# Patient Record
Sex: Female | Born: 1940 | Race: White | Marital: Married | Smoking: Never smoker
Health system: Southern US, Community
[De-identification: ages and names within clinical notes are randomized; demographics above are authoritative.]

## PROBLEM LIST (undated history)

## (undated) DIAGNOSIS — M349 Systemic sclerosis, unspecified: Secondary | ICD-10-CM

## (undated) DIAGNOSIS — H409 Unspecified glaucoma: Secondary | ICD-10-CM

## (undated) DIAGNOSIS — I10 Essential (primary) hypertension: Secondary | ICD-10-CM

## (undated) DIAGNOSIS — K219 Gastro-esophageal reflux disease without esophagitis: Secondary | ICD-10-CM

## (undated) DIAGNOSIS — J449 Chronic obstructive pulmonary disease, unspecified: Secondary | ICD-10-CM

## (undated) DIAGNOSIS — E079 Disorder of thyroid, unspecified: Secondary | ICD-10-CM

## (undated) DIAGNOSIS — Z86010 Personal history of colonic polyps: Secondary | ICD-10-CM

## (undated) HISTORY — DX: Gastro-esophageal reflux disease without esophagitis: K21.9

## (undated) HISTORY — DX: Systemic sclerosis, unspecified: M34.9

## (undated) HISTORY — DX: Personal history of colonic polyps: Z86.010

## (undated) HISTORY — PX: REPLACEMENT TOTAL KNEE: SUR1224

## (undated) HISTORY — DX: Chronic obstructive pulmonary disease, unspecified: J44.9

## (undated) HISTORY — DX: Essential (primary) hypertension: I10

## (undated) HISTORY — PX: CATARACT EXTRACTION, BILATERAL: SHX1313

## (undated) HISTORY — PX: COLONOSCOPY: SHX174

## (undated) HISTORY — DX: Unspecified glaucoma: H40.9

## (undated) HISTORY — DX: Disorder of thyroid, unspecified: E07.9

---

## 1984-02-08 HISTORY — PX: BACK SURGERY: SHX140

## 2017-03-09 HISTORY — PX: ESOPHAGOGASTRODUODENOSCOPY: SHX1529

## 2019-09-25 DIAGNOSIS — R079 Chest pain, unspecified: Secondary | ICD-10-CM | POA: Diagnosis not present

## 2019-09-27 DIAGNOSIS — R079 Chest pain, unspecified: Secondary | ICD-10-CM | POA: Diagnosis not present

## 2019-12-23 ENCOUNTER — Encounter: Payer: Self-pay | Admitting: Gastroenterology

## 2019-12-23 ENCOUNTER — Ambulatory Visit: Payer: Medicare Other | Admitting: Gastroenterology

## 2019-12-23 VITALS — BP 124/70 | HR 80 | Ht 59.0 in | Wt 119.4 lb

## 2019-12-23 DIAGNOSIS — K219 Gastro-esophageal reflux disease without esophagitis: Secondary | ICD-10-CM

## 2019-12-23 DIAGNOSIS — Z8739 Personal history of other diseases of the musculoskeletal system and connective tissue: Secondary | ICD-10-CM | POA: Diagnosis not present

## 2019-12-23 DIAGNOSIS — R1319 Other dysphagia: Secondary | ICD-10-CM | POA: Diagnosis not present

## 2019-12-23 MED ORDER — PANTOPRAZOLE SODIUM 40 MG PO TBEC
40.0000 mg | DELAYED_RELEASE_TABLET | Freq: Two times a day (BID) | ORAL | 11 refills | Status: DC
Start: 2019-12-23 — End: 2020-05-19

## 2019-12-23 NOTE — Progress Notes (Signed)
Chief Complaint: Dysphagia  Referring Provider:  Quitman Livings, MD      ASSESSMENT AND PLAN;   #1. GERD with eso dysphagia. H/O eso stricture,  D/d Schatzki's ring, motility disorder, EoE, pill induced esophagitis, r/o esophageal carcinoma/extrinsic lesions or Achalasia.  #2. 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 Constipation. Neg colon at age 79 per pt at [facility]. No need for rpt colon.  Plan: -Protonix 40 mg p.o. BID, #60.  11 refills. -Ba swallow with Ba tablet as well. -EGD with eso bx and possibly dil.  Discussed risks and benefits including small but definite risks of eso perforation, bleeding, risks of anesthesia.  The benefits were also discussed. Consent forms given. -I have instructed patient to chew foods especially meats and breads well and eat slowly. -Colace 1 tab po qd.    HPI:    Jenna Moran is a 79 y.o. 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  C/O dysphagia to both solids/liquids since Aug 2021.  Claims that food gets hung up in the throat area especially solids and liquids would get hung up in the lower chest.  She denies having any odynophagia.  Occasional regurgitation.  Solids mostly like meats and breads.  Had about 1 to 2 pound weight loss over the last 3 months.  Denies having any heartburn as long as she takes Protonix twice a day.  She has been on Nexium in the past which worked well.  Unfortunately, her insurance does not cover it.  No melena or hematochezia.  C/O constipation despite drinking plenty of water.  Has bowel movements at the frequency of 1 to 2/week.  Denies having any significant abdominal bloating.  No fever chills or night sweats.  No nonsteroidals.   From past records: -Known  scleroderma associated erosive esophagitis and stricture.  Multiple dilatations with Dr. Norma Fredrickson. -EGD 01/05/2017: esophageal stricture, dilated to 16.94mm and grade D esophagitis. She is here today for repeat EGD with dilation following BID PPI therapy.   -EGD 1/2019at [facility]: Post-operative Diagnosis: 1. Peptic stricture, appears improved since last dilation. This was dilated to 18 mm with mucosal disruption. Healed esophagitis. 2. Mod HH. Bx done to r/o EoE- I didn't see Bx report in care everywhere.    PMH: . Undifferentiated connective tissue disease (HCC)  . Inflammatory arthritis  . Scleroderma (HCC)  . Mild acid reflux  . ILD (interstitial lung disease) (HCC)  . Gastroesophageal reflux disease with esophagitis   Past Medical History:  Diagnosis Date  . GERD (gastroesophageal reflux disease)   . History of colonic polyps   . Hypertension   . Scleroderma (HCC)   . Thyroid disease     Past Surgical History:  Procedure Laterality Date  . CATARACT EXTRACTION, BILATERAL    . COLONOSCOPY     age 81 [facility]  . ESOPHAGOGASTRODUODENOSCOPY     around 2018 [facility]   . REPLACEMENT TOTAL KNEE Right     Family History  Problem Relation Age of Onset  . Colon cancer Maternal Grandmother   . Esophageal cancer Neg Hx     Social History   Tobacco Use  . Smoking status: Never Smoker  . Smokeless tobacco: Never Used  Vaping Use  . Vaping Use: Never used  Substance Use Topics  .  Alcohol use: Not Currently  . Drug use: Never    Current Outpatient Medications  Medication Sig Dispense Refill  . amLODipine (NORVASC) 5 MG tablet Take 5 mg by mouth daily.    . EUTHYROX 150 MCG tablet Take 1 tablet by mouth daily.    Marland Kitchen gabapentin (NEURONTIN) 300 MG capsule Take 300 mg by mouth 3 (three) times daily.    . Multiple Vitamins-Minerals (MULTIVITAMIN ADULTS 50+) TABS Take 1 tablet by mouth daily.    . pantoprazole (PROTONIX) 40 MG tablet Take 1 tablet by mouth  every morning.     No current facility-administered medications for this visit.    Allergies  Allergen Reactions  . Levofloxacin Rash and Hives  . Codeine Nausea And Vomiting and Other (See Comments)    "Deathly sick"   . Hydrocodone Nausea And Vomiting  . Oxycodone Nausea And Vomiting  . Plaquenil [Hydroxychloroquine]     Review of Systems:  Constitutional: Denies fever, chills, diaphoresis, appetite change and fatigue.  HEENT: Denies photophobia, eye pain, redness, hearing loss, ear pain, congestion, sore throat, rhinorrhea, sneezing, mouth sores, neck pain, neck stiffness and tinnitus.   Respiratory: Has occasional shortness of breath especially when she walks for a longer time and when she uses stairs. Cardiovascular: Denies chest pain, palpitations and leg swelling.  Genitourinary: Denies dysuria, urgency, frequency, hematuria, flank pain and difficulty urinating.  Musculoskeletal: Denies myalgias, back pain, joint swelling, arthralgias and gait problem.  Skin: No rash.  Neurological: Denies dizziness, seizures, syncope, weakness, light-headedness, numbness and headaches.  Hematological: Denies adenopathy. Easy bruising, personal or family bleeding history  Psychiatric/Behavioral: No anxiety or depression     Physical Exam:    BP 124/70   Pulse 80   Ht 4\' 11"  (1.499 m)   Wt 119 lb 6 oz (54.1 kg)   BMI 24.11 kg/m  Wt Readings from Last 3 Encounters:  12/23/19 119 lb 6 oz (54.1 kg)   Constitutional:  Well-developed, in no acute distress. Psychiatric: Normal mood and affect. Behavior is normal. HEENT: Conjunctivae are normal. No scleral icterus.  Cardiovascular: Normal rate, regular rhythm. No edema Pulmonary/chest: B/L decreased breath sounds with fine rales.  No wheezing. Abdominal: Soft, nondistended. Nontender. Bowel sounds active throughout. There are no masses palpable. No hepatomegaly. Rectal: Deferred Neurological: Alert and oriented to person place and  time. Skin: Skin is warm and dry. No rashes noted.  Data Reviewed: I have personally reviewed following labs and imaging studies  Labs were reviewed: -BUN/creatinine 54/1.13 with GFR 47 mL/min.  Potassium 5.1, albumin 4.3, normal LFTs.  Hemoglobin 11.5, platelets 95, MCV 92.  She has follow-up appointment with Dr. 12/25/19 to discuss labs and further work-up.   Dierdre Forth, MD 12/23/2019, 3:17 PM  Cc: 12/25/2019, MD

## 2019-12-23 NOTE — Patient Instructions (Signed)
If you are age 77 or older, your body mass index should be between 23-30. Your Body mass index is 24.11 kg/m. If this is out of the aforementioned range listed, please consider follow up with your Primary Care Provider.  If you are age 104 or younger, your body mass index should be between 19-25. Your Body mass index is 24.11 kg/m. If this is out of the aformentioned range listed, please consider follow up with your Primary Care Provider.   We have sent the following medications to your pharmacy for you to pick up at your convenience: Protonix   You have been scheduled for a Barium Esophogram at [facility] (1st floor of the hospital) on 12/31/19 at 10:30am. Please arrive 15 minutes prior to your appointment for registration. Make certain not to have anything to eat or drink 3 hours prior to your test. If you need to reschedule for any reason, please contact radiology at [phone] to do so. __________________________________________________________________ A barium swallow is an examination that concentrates on views of the esophagus. This tends to be a double contrast exam (barium and two liquids which, when combined, create a gas to distend the wall of the oesophagus) or single contrast (non-ionic iodine based). The study is usually tailored to your symptoms so a good history is essential. Attention is paid during the study to the form, structure and configuration of the esophagus, looking for functional disorders (such as aspiration, dysphagia, achalasia, motility and reflux) EXAMINATION You may be asked to change into a gown, depending on the type of swallow being performed. A radiologist and radiographer will perform the procedure. The radiologist will advise you of the type of contrast selected for your procedure and direct you during the exam. You will be asked to stand, sit or lie in several different positions and to hold a small amount of fluid in your mouth before being asked to  swallow while the imaging is performed .In some instances you may be asked to swallow barium coated marshmallows to assess the motility of a solid food bolus. The exam can be recorded as a digital or video fluoroscopy procedure. POST PROCEDURE It will take 1-2 days for the barium to pass through your system. To facilitate this, it is important, unless otherwise directed, to increase your fluids for the next 24-48hrs and to resume your normal diet.  This test typically takes about 30 minutes to perform. __________________________________________________________________________________  Bonita Quin have been scheduled for an endoscopy. Please follow written instructions given to you at your visit today. If you use inhalers (even only as needed), please bring them with you on the day of your procedure.   Please purchase the following medications over the counter and take as directed: Colace once daily.   Thank you,  Dr. Lynann Bologna

## 2019-12-27 ENCOUNTER — Encounter: Payer: Self-pay | Admitting: Gastroenterology

## 2019-12-31 ENCOUNTER — Ambulatory Visit (HOSPITAL_COMMUNITY)
Admission: RE | Admit: 2019-12-31 | Discharge: 2019-12-31 | Disposition: A | Discharge status: Home / Self Care | Payer: Medicare Other | Source: Ambulatory Visit | Attending: Gastroenterology | Admitting: Gastroenterology

## 2019-12-31 ENCOUNTER — Other Ambulatory Visit: Payer: Self-pay

## 2019-12-31 DIAGNOSIS — Z8739 Personal history of other diseases of the musculoskeletal system and connective tissue: Secondary | ICD-10-CM | POA: Insufficient documentation

## 2019-12-31 DIAGNOSIS — K219 Gastro-esophageal reflux disease without esophagitis: Secondary | ICD-10-CM | POA: Diagnosis not present

## 2019-12-31 DIAGNOSIS — R1319 Other dysphagia: Secondary | ICD-10-CM | POA: Insufficient documentation

## 2020-01-01 NOTE — Progress Notes (Signed)
Please inform the patient. Barium swallow-showing focal stricture, disruption of primary peristaltic waves consistent with scleroderma esophagus, small hiatal hernia and also aspiration Plan: -Proceed with EGD with dil -Also speech consultation Send report to family physician

## 2020-01-06 ENCOUNTER — Other Ambulatory Visit: Payer: Self-pay | Admitting: Gastroenterology

## 2020-01-06 NOTE — Progress Notes (Signed)
amb  

## 2020-01-07 ENCOUNTER — Telehealth: Payer: Self-pay | Admitting: Gastroenterology

## 2020-01-07 NOTE — Telephone Encounter (Signed)
Spoke to Jenna Moran from [employer]. She is requesting an order for a Modified Barium Swallow to complete the workup for speech therapy. Patient only had regular barium swallow. Order faxed over. They will contact the patient with a date and time. Once complete,she will be set up for speech therapy

## 2020-01-07 NOTE — Telephone Encounter (Signed)
Sarah from Rehab Dept at [facility] is requesting a call back from a nurse to discuss an order that she needs faxed over for a Modified Barium Swallow (pt only had a regular barium swallow)  CB [phone] Fax: [phone]

## 2020-01-10 ENCOUNTER — Encounter: Payer: Self-pay | Admitting: Gastroenterology

## 2020-01-10 ENCOUNTER — Ambulatory Visit (AMBULATORY_SURGERY_CENTER): Payer: Medicare Other | Admitting: Gastroenterology

## 2020-01-10 ENCOUNTER — Other Ambulatory Visit: Payer: Self-pay

## 2020-01-10 VITALS — BP 125/56 | HR 65 | Temp 98.0°F | Resp 22 | Ht 59.0 in | Wt 119.0 lb

## 2020-01-10 DIAGNOSIS — K225 Diverticulum of esophagus, acquired: Secondary | ICD-10-CM

## 2020-01-10 DIAGNOSIS — K219 Gastro-esophageal reflux disease without esophagitis: Secondary | ICD-10-CM | POA: Diagnosis not present

## 2020-01-10 DIAGNOSIS — K222 Esophageal obstruction: Secondary | ICD-10-CM

## 2020-01-10 DIAGNOSIS — R1319 Other dysphagia: Secondary | ICD-10-CM

## 2020-01-10 DIAGNOSIS — K449 Diaphragmatic hernia without obstruction or gangrene: Secondary | ICD-10-CM

## 2020-01-10 DIAGNOSIS — K2210 Ulcer of esophagus without bleeding: Secondary | ICD-10-CM

## 2020-01-10 MED ORDER — SUCRALFATE 1 G PO TABS: 1.0000 g | ORAL_TABLET | Freq: Four times a day (QID) | ORAL | 0 refills | Status: DC

## 2020-01-10 MED ORDER — SODIUM CHLORIDE 0.9 % IV SOLN: 500.0000 mL | Freq: Once | INTRAVENOUS | Status: DC

## 2020-01-10 NOTE — Progress Notes (Signed)
A and O x3. Report to RN. Tolerated MAC anesthesia well.Gums unchanged after procedure.

## 2020-01-10 NOTE — Patient Instructions (Signed)
Discharge instructions given. Handouts on a Dilatation diet,Esophagitis, and a hiatal hernia. Resume previous medications. YOU HAD AN ENDOSCOPIC PROCEDURE TODAY AT THE [facility]:   Refer to the procedure report that was given to you for any specific questions about what was found during the examination.  If the procedure report does not answer your questions, please call your gastroenterologist to clarify.  If you requested that your care partner not be given the details of your procedure findings, then the procedure report has been included in a sealed envelope for you to review at your convenience later.  YOU SHOULD EXPECT: Some feelings of bloating in the abdomen. Passage of more gas than usual.  Walking can help get rid of the air that was put into your GI tract during the procedure and reduce the bloating. If you had a lower endoscopy (such as a colonoscopy or flexible sigmoidoscopy) you may notice spotting of blood in your stool or on the toilet paper. If you underwent a bowel prep for your procedure, you may not have a normal bowel movement for a few days.  Please Note:  You might notice some irritation and congestion in your nose or some drainage.  This is from the oxygen used during your procedure.  There is no need for concern and it should clear up in a day or so.  SYMPTOMS TO REPORT IMMEDIATELY:     Following upper endoscopy (EGD)  Vomiting of blood or coffee ground material  New chest pain or pain under the shoulder blades  Painful or persistently difficult swallowing  New shortness of breath  Fever of 100F or higher  Black, tarry-looking stools  For urgent or emergent issues, a gastroenterologist can be reached at any hour by calling [phone]. Do not use MyChart messaging for urgent concerns.    DIET:  We do recommend a small meal at first, but then you may proceed to your regular diet.  Drink plenty of fluids but you should avoid alcoholic beverages for  24 hours.  ACTIVITY:  You should plan to take it easy for the rest of today and you should NOT DRIVE or use heavy machinery until tomorrow (because of the sedation medicines used during the test).    FOLLOW UP: Our staff will call the number listed on your records 48-72 hours following your procedure to check on you and address any questions or concerns that you may have regarding the information given to you following your procedure. If we do not reach you, we will leave a message.  We will attempt to reach you two times.  During this call, we will ask if you have developed any symptoms of COVID 19. If you develop any symptoms (ie: fever, flu-like symptoms, shortness of breath, cough etc.) before then, please call [phone].  If you test positive for Covid 19 in the 2 weeks post procedure, please call and report this information to [place].    If any biopsies were taken you will be contacted by phone or by letter within the next 1-3 weeks.  Please call us at [phone] if you have not heard about the biopsies in 3 weeks.    SIGNATURES/CONFIDENTIALITY: You and/or your care partner have signed paperwork which will be entered into your electronic medical record.  These signatures attest to the fact that that the information above on your After Visit Summary has been reviewed and is understood.  Full responsibility of the confidentiality of this discharge information lies with you  and/or your care-partner. 

## 2020-01-10 NOTE — Progress Notes (Signed)
Called to room to assist during endoscopic procedure.  Patient ID and intended procedure confirmed with present staff. Received instructions for my participation in the procedure from the performing physician.  

## 2020-01-10 NOTE — Op Note (Signed)
[facility] Patient Name: Jenna Moran Procedure Date: 01/10/2020 1:29 PM MRN: [id number] Endoscopist: Lynann Bologna , MD Age: 79 Referring MD:  Date of Birth: 1940-02-23 Gender: Female Account #: [id number] Procedure:                Upper GI endoscopy Indications:              Dysphagia with abnormal barium swallow showing                            distal esophageal stricture. History of scleroderma                            esophagus. Medicines:                Monitored Anesthesia Care Procedure:                Pre-Anesthesia Assessment:                           - Prior to the procedure, a History and Physical                            was performed, and patient medications and                            allergies were reviewed. The patient's tolerance of                            previous anesthesia was also reviewed. The risks                            and benefits of the procedure and the sedation                            options and risks were discussed with the patient.                            All questions were answered, and informed consent                            was obtained. Prior Anticoagulants: The patient has                            taken no previous anticoagulant or antiplatelet                            agents. ASA Grade Assessment: III - A patient with                            severe systemic disease. After reviewing the risks                            and benefits, the patient was deemed in  satisfactory condition to undergo the procedure.                           After obtaining informed consent, the endoscope was                            passed under direct vision. Throughout the                            procedure, the patient's blood pressure, pulse, and                            oxygen saturations were monitored continuously. The                            Endoscope was introduced through the mouth,  and                            advanced to the second part of duodenum. The upper                            GI endoscopy was accomplished without difficulty.                            The patient tolerated the procedure well. Scope In: Scope Out: Findings:                 One benign-appearing, intrinsic moderate                            (circumferential scarring or stenosis; an endoscope                            may pass) stenosis was found 35 cm from the                            incisors. This stenosis measured 9 mm (inner                            diameter) x less than one cm (in length). This                            would barely allow passage of GIF H190. The                            stenosis was traversed. There was moderate                            surrounding circumferential esophagitis with 8 mm                            diverticulum just proximal to the stricture. A TTS  dilator was passed through the scope. Dilation with                            an 12-20-11 mm balloon dilator was performed to 13                            mm. The dilation site was examined and showed mild                            mucosal disruption and moderate improvement in                            luminal narrowing. Biopsies were taken with a cold                            forceps for histology.                           A 2 cm hiatal hernia was present.                           The entire examined stomach was normal.                           The examined duodenum was normal. Complications:            No immediate complications. Estimated Blood Loss:     Estimated blood loss: none. Impression:               - Benign-appearing esophageal stenosis with grade D                            esophagitis. Dilated. Biopsied.                           - 8 mm esophageal diverticulum just proximal to the                            stricture.                           -  2 cm hiatal hernia. Recommendation:           - Patient has a contact number available for                            emergencies. The signs and symptoms of potential                            delayed complications were discussed with the                            patient. Return to normal activities tomorrow.                            Written discharge instructions were provided to the  patient.                           - Post dilatation diet.                           - Continue present medications including Protonix                            40 mg p.o. twice daily                           - Use sucralfate suspension 1 gram PO QID for 2                            weeks.                           - Recommend repeat EGD with further esophageal                            dilatation in 4 to 6 weeks.                           - Need to chew foods especially meats and breads                            well and eat slowly.                           - The findings and recommendations were discussed                            with the patient's family. Lynann Bologna, MD 01/10/2020 2:07:23 PM This report has been signed electronically.

## 2020-01-13 ENCOUNTER — Encounter: Payer: Medicare Other | Admitting: Gastroenterology

## 2020-01-13 NOTE — Telephone Encounter (Signed)
Fax from [facility] came back saying "spoke to her for the second time today. he declined her MBS study. Says she has an appt with Dr Chales Abrahams to evaluate swallowing on the 23rd"

## 2020-01-14 ENCOUNTER — Telehealth: Payer: Self-pay

## 2020-01-14 MED ORDER — ONDANSETRON 4 MG PO TBDP: 4.0000 mg | ORAL_TABLET | Freq: Three times a day (TID) | ORAL | 0 refills | Status: DC | PRN

## 2020-01-14 NOTE — Telephone Encounter (Signed)
Patient notified of the new recommendations.  She verbalized understanding of new recommendations and rx. No vomiting since phone call.

## 2020-01-14 NOTE — Telephone Encounter (Signed)
Left message for patient to call back New rx sent 

## 2020-01-14 NOTE — Telephone Encounter (Signed)
Called her at 9 AM No Ans Left message  Tresa Endo, Can you pl call and find out how she is doing? RG   For now Plan: -Continie protonix 40mg  po bid -Hold carafare -Zofran 4mg  ODT Q 8hrs prn  nausea #20  RG

## 2020-01-14 NOTE — Telephone Encounter (Signed)
  Follow up Call-  Call back number 01/10/2020  Post procedure Call Back phone  # [phone]  Permission to leave phone message Yes     Patient questions:  Do you have a fever, pain , or abdominal swelling? No. Pain Score  0 *  Have you tolerated food without any problems? No.  Have you been able to return to your normal activities? No.  Do you have any questions about your discharge instructions: Diet   Yes.   Medications  No. Follow up visit  No.  Do you have questions or concerns about your Care? Yes.     Called patient for post procedure f/u call. She state she hasn't been able to keep any food down since her procedure on Friday. She has vomited after completing every meal. She denies any coffee ground emesis, states it is "just food" coming back up. She has been able to keep fluids down such as water, pedialyte, and orange juice. Her last normal stool was Thursday the day before her procedure. She had stool yesterday that was hard, not blood noted, wasn't black and tarry per patient. She denies any other symptoms such as fever, cough, etc. Informed patient that I will send a message to Dr. Chales Abrahams immediately and get back with her today. Encouraged her to continue drinking fluids as she can to prevent dehydration. Pt. Verbalizes understanding and states she will do so. Best call back # is [phone]  Actions: * If pain score is 4 or above: No action needed, pain <4.  1. Have you developed a fever since your procedure? no  2.   Have you had an respiratory symptoms (SOB or cough) since your procedure? no  3.   Have you tested positive for COVID 19 since your procedure no  4.   Have you had any family members/close contacts diagnosed with the COVID 19 since your procedure?  no   If yes to any of these questions please route to Laverna Peace, RN and Karlton Lemon, RN

## 2020-01-21 ENCOUNTER — Telehealth: Payer: Self-pay | Admitting: Gastroenterology

## 2020-01-21 NOTE — Telephone Encounter (Signed)
Patient called states she is dihydrated seeking advise

## 2020-01-22 ENCOUNTER — Encounter: Payer: Self-pay | Admitting: Gastroenterology

## 2020-01-22 NOTE — Telephone Encounter (Signed)
LMOM for patient to call back

## 2020-01-22 NOTE — Telephone Encounter (Signed)
LMOM for patient to call back.

## 2020-01-23 NOTE — Telephone Encounter (Signed)
LMOM for patient to call opur office if she still feels dehydrated and needs further treatment.

## 2020-01-28 NOTE — Telephone Encounter (Signed)
LMOM for patient to call back.

## 2020-01-28 NOTE — Telephone Encounter (Signed)
Pt is requesting a call back from a nurse (missed call) 

## 2020-01-29 NOTE — Telephone Encounter (Signed)
LMOM for patient to call back.

## 2020-01-30 ENCOUNTER — Ambulatory Visit (AMBULATORY_SURGERY_CENTER): Payer: Self-pay | Admitting: *Deleted

## 2020-01-30 ENCOUNTER — Other Ambulatory Visit: Payer: Self-pay

## 2020-01-30 VITALS — Ht 59.0 in | Wt 119.0 lb

## 2020-01-30 DIAGNOSIS — R1319 Other dysphagia: Secondary | ICD-10-CM

## 2020-01-30 NOTE — Progress Notes (Signed)
Patient is here in-person for PV via wheelchair. Patient states she is able to stand and walk with assistance, husband with her today. Patient denies any allergies to eggs or soy. Patient denies any problems with anesthesia/sedation. Patient denies any oxygen use at home. Patient denies taking any diet/weight loss medications or blood thinners. Patient denies any medical hx changes since last EGD. Patient is not being treated for MRSA or C-diff. Patient is aware of our care-partner policy and Covid-19 safety protocol.  COVID-19 vaccines completed on 12/2019 booster, per patient.

## 2020-02-05 ENCOUNTER — Encounter: Payer: Self-pay | Admitting: Gastroenterology

## 2020-02-13 ENCOUNTER — Ambulatory Visit (AMBULATORY_SURGERY_CENTER): Payer: Medicare Other | Admitting: Gastroenterology

## 2020-02-13 ENCOUNTER — Encounter: Payer: Self-pay | Admitting: Gastroenterology

## 2020-02-13 ENCOUNTER — Other Ambulatory Visit: Payer: Self-pay

## 2020-02-13 VITALS — BP 130/70 | HR 75 | Temp 97.3°F | Resp 26

## 2020-02-13 DIAGNOSIS — K222 Esophageal obstruction: Secondary | ICD-10-CM | POA: Diagnosis not present

## 2020-02-13 DIAGNOSIS — R1319 Other dysphagia: Secondary | ICD-10-CM

## 2020-02-13 DIAGNOSIS — K449 Diaphragmatic hernia without obstruction or gangrene: Secondary | ICD-10-CM

## 2020-02-13 DIAGNOSIS — K219 Gastro-esophageal reflux disease without esophagitis: Secondary | ICD-10-CM

## 2020-02-13 DIAGNOSIS — K2970 Gastritis, unspecified, without bleeding: Secondary | ICD-10-CM

## 2020-02-13 MED ORDER — SODIUM CHLORIDE 0.9 % IV SOLN: 500.0000 mL | Freq: Once | INTRAVENOUS | Status: DC

## 2020-02-13 NOTE — Progress Notes (Signed)
Called to room to assist during endoscopic procedure.  Patient ID and intended procedure confirmed with present staff. Received instructions for my participation in the procedure from the performing physician.  

## 2020-02-13 NOTE — Progress Notes (Signed)
Report given to PACU, vss 

## 2020-02-13 NOTE — Progress Notes (Signed)
VS taken by BC 

## 2020-02-13 NOTE — Progress Notes (Signed)
Pt's states no medical or surgical changes since previsit or office visit. 

## 2020-02-13 NOTE — Progress Notes (Addendum)
Pt states, "I am cold and my legs hurt".  Per pt's husband she has hx of restless leg syndrome.  10:55`Pt rates pain 10/10. Alease Frame, RN reported this to Dr. Chales Abrahams.   I asked pt if she took her Gabapentin this am.  She said she did.  Pt reported that she was out of her muscle relaxer medication.  I advised pt's husband to call the pharmacy and see if they could see if a relill is available if not, get phamacy to call ask for a refill from the provider. Dr. Chales Abrahams was aware and ok with this measure.   Also told him the office will call when the schedule is out to reschedule egd with dil.   Per Dr. Chales Abrahams, repeat EGD with esophageal dilatation in 8-12 weeks.  The schedule is not out that far today.  I put this in the computer AS A RECALL egd.  MAW

## 2020-02-13 NOTE — Patient Instructions (Addendum)
Handouts were given to you on esophageal stricture, esophageal dilatation diet to follow the rest of today, gastritis, and a hiatal hernia. You may resume your current medications today. Await biopsy results.  May take 2-3 weeks to receive pathology results. Please call if any questions or concerns.     YOU HAD AN ENDOSCOPIC PROCEDURE TODAY AT THE [facility]:   Refer to the procedure report that was given to you for any specific questions about what was found during the examination.  If the procedure report does not answer your questions, please call your gastroenterologist to clarify.  If you requested that your care partner not be given the details of your procedure findings, then the procedure report has been included in a sealed envelope for you to review at your convenience later.  YOU SHOULD EXPECT: Some feelings of bloating in the abdomen. Passage of more gas than usual.  Walking can help get rid of the air that was put into your GI tract during the procedure and reduce the bloating. If you had a lower endoscopy (such as a colonoscopy or flexible sigmoidoscopy) you may notice spotting of blood in your stool or on the toilet paper. If you underwent a bowel prep for your procedure, you may not have a normal bowel movement for a few days.  Please Note:  You might notice some irritation and congestion in your nose or some drainage.  This is from the oxygen used during your procedure.  There is no need for concern and it should clear up in a day or so.  SYMPTOMS TO REPORT IMMEDIATELY:    Following upper endoscopy (EGD)  Vomiting of blood or coffee ground material  New chest pain or pain under the shoulder blades  Painful or persistently difficult swallowing  New shortness of breath  Fever of 100F or higher  Black, tarry-looking stools  For urgent or emergent issues, a gastroenterologist can be reached at any hour by calling [phone]. Do not use MyChart messaging for  urgent concerns.    DIET:  Please follow the esophageal dilatation diet the rest of today.  Handout was given to you to refer to. Please CHEW FOOD ESPECIALLY MEATS WELL AND EAT SLOWLY.  Drink plenty of fluids but you should avoid alcoholic beverages for 24 hours.  ACTIVITY:  You should plan to take it easy for the rest of today and you should NOT DRIVE or use heavy machinery until tomorrow (because of the sedation medicines used during the test).    FOLLOW UP: Our staff will call the number listed on your records 48-72 hours following your procedure to check on you and address any questions or concerns that you may have regarding the information given to you following your procedure. If we do not reach you, we will leave a message.  We will attempt to reach you two times.  During this call, we will ask if you have developed any symptoms of COVID 19. If you develop any symptoms (ie: fever, flu-like symptoms, shortness of breath, cough etc.) before then, please call [phone].  If you test positive for Covid 19 in the 2 weeks post procedure, please call and report this information to [place].    If any biopsies were taken you will be contacted by phone or by letter within the next 1-3 weeks.  Please call us at [phone] if you have not heard about the biopsies in 3 weeks.    SIGNATURES/CONFIDENTIALITY: You and/or your care partner have  signed paperwork which will be entered into your electronic medical record.  These signatures attest to the fact that that the information above on your After Visit Summary has been reviewed and is understood.  Full responsibility of the confidentiality of this discharge information lies with you and/or your care-partner.

## 2020-02-13 NOTE — Op Note (Signed)
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 Medicines:                Monitored Anesthesia Care Procedure:                Pre-Anesthesia Assessment:                           - Prior to the procedure, a History and Physical                            was performed, and patient medications and                            allergies were reviewed. The patient's tolerance of                            previous anesthesia was also reviewed. The risks                            and benefits of the procedure and the sedation                            options and risks were discussed with the patient.                            All questions were answered, and informed consent                            was obtained. Prior Anticoagulants: The patient has                            taken no previous anticoagulant or antiplatelet                            agents. ASA Grade Assessment: III - A patient with                            severe systemic disease. After reviewing the risks                            and benefits, the patient was deemed in                            satisfactory condition to undergo the procedure.  After obtaining informed consent, the endoscope was                            passed under direct vision. Throughout the                            procedure, the patient's blood pressure, pulse, and                            oxygen saturations were monitored continuously. The                            Endoscope was introduced through the mouth, and                            advanced to the second part of  duodenum. The upper                            GI endoscopy was accomplished without difficulty.                            The patient tolerated the procedure well. Scope In: Scope Out: Findings:                 One benign-appearing, intrinsic moderate                            (circumferential scarring or stenosis; an endoscope                            may pass) stenosis was found 35 cm from the                            incisors at GE junction. This stenosis measured 1                            cm (inner diameter) x less than one cm (in length).                            A 8 mm diverticula was noted just proximal to the                            stricture. The stenosis was traversed. A TTS                            dilator was passed through the scope. Dilation with                            a 13.5-14.5-15.5 mm balloon dilator was performed                            to 15.5 mm. The dilation site was examined and  showed mild mucosal disruption and moderate                            improvement in luminal narrowing. Biopsies were                            taken with a cold forceps for histology from the                            stricture.                           A 2 cm hiatal hernia was present.                           The entire examined stomach was normal except for                            atrophic gastritis. Biopsies were taken with a cold                            forceps for histology.                           The examined duodenum was normal. Biopsies for                            histology were taken with a cold forceps for                            evaluation of celiac disease. Complications:            No immediate complications. Estimated Blood Loss:     Estimated blood loss: none. 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 Dilated. Biopsied.                           - 2 cm hiatal hernia.                           - Atrophic gastritis Recommendation:           - Patient has a contact number available for                            emergencies. The signs and symptoms of potential                            delayed complications were discussed with the  patient. Return to normal activities tomorrow.                            Written discharge instructions were provided to the                            patient.                           - Post dilatation diet.                           - Continue present medications.                           - Await pathology results.                           - Recommend repeating EGD in 8-12 weeks with                            further dilatation.                           - Chew food especially meats well and eat slowly.                           - The findings and recommendations were discussed                            with the patient's family. Jackquline Denmark, MD 02/13/2020 10:21:18 AM This report has been signed electronically.

## 2020-02-17 ENCOUNTER — Telehealth: Payer: Self-pay | Admitting: *Deleted

## 2020-02-17 ENCOUNTER — Telehealth: Payer: Self-pay

## 2020-02-17 NOTE — Telephone Encounter (Signed)
Called [phone] and left a message we tried to reach pt for a follow up call. maw

## 2020-02-17 NOTE — Telephone Encounter (Signed)
  Follow up Call-  Call back number 02/13/2020 01/10/2020  Post procedure Call Back phone  # [phone]  Permission to leave phone message Yes Yes     Patient questions:  Do you have a fever, pain , or abdominal swelling? No. Pain Score  0 *  Have you tolerated food without any problems? Yes.    Have you been able to return to your normal activities? Yes.    Do you have any questions about your discharge instructions: Diet   No. Medications  No. Follow up visit  No.  Do you have questions or concerns about your Care? No.  Actions: * If pain score is 4 or above: No action needed, pain <4.  1. Have you developed a fever since your procedure? no  2.   Have you had an respiratory symptoms (SOB or cough) since your procedure? no  3.   Have you tested positive for COVID 19 since your procedure no  4.   Have you had any family members/close contacts diagnosed with the COVID 19 since your procedure?  no   If yes to any of these questions please route to Laverna Peace, RN and Karlton Lemon, RN

## 2020-02-24 ENCOUNTER — Encounter: Payer: Self-pay | Admitting: Gastroenterology

## 2020-05-05 ENCOUNTER — Other Ambulatory Visit: Payer: Self-pay

## 2020-05-05 ENCOUNTER — Ambulatory Visit (AMBULATORY_SURGERY_CENTER): Payer: Self-pay | Admitting: *Deleted

## 2020-05-05 VITALS — Ht 59.0 in | Wt 112.0 lb

## 2020-05-05 DIAGNOSIS — R1319 Other dysphagia: Secondary | ICD-10-CM

## 2020-05-05 NOTE — Progress Notes (Signed)
Patient and husband is here in-person for PV. Patient denies any allergies to eggs or soy. Patient denies any problems with anesthesia/sedation. Patient denies any oxygen use at home. Patient denies taking any diet/weight loss medications or blood thinners. Patient is not being treated for MRSA or C-diff. Patient is aware of our care-partner policy and Covid-19 safety protocol. Patient denies any medical hx changes since last GI visit.  Patient is fully COVID-19 vaccinated, per patient.

## 2020-05-15 ENCOUNTER — Encounter: Payer: Self-pay | Admitting: Gastroenterology

## 2020-05-19 ENCOUNTER — Other Ambulatory Visit: Payer: Self-pay

## 2020-05-19 ENCOUNTER — Encounter: Payer: Self-pay | Admitting: Gastroenterology

## 2020-05-19 ENCOUNTER — Ambulatory Visit (AMBULATORY_SURGERY_CENTER): Payer: Medicare Other | Admitting: Gastroenterology

## 2020-05-19 VITALS — BP 151/66 | HR 16 | Temp 97.5°F | Resp 17 | Ht 59.0 in | Wt 112.0 lb

## 2020-05-19 DIAGNOSIS — K449 Diaphragmatic hernia without obstruction or gangrene: Secondary | ICD-10-CM

## 2020-05-19 DIAGNOSIS — R1319 Other dysphagia: Secondary | ICD-10-CM

## 2020-05-19 DIAGNOSIS — K222 Esophageal obstruction: Secondary | ICD-10-CM | POA: Diagnosis not present

## 2020-05-19 MED ORDER — SUCRALFATE 1 G PO TABS: 1.0000 g | ORAL_TABLET | Freq: Four times a day (QID) | ORAL | 2 refills | Status: DC

## 2020-05-19 MED ORDER — PANTOPRAZOLE SODIUM 40 MG PO TBEC: 40.0000 mg | DELAYED_RELEASE_TABLET | Freq: Two times a day (BID) | ORAL | 11 refills | Status: AC

## 2020-05-19 MED ORDER — SUCRALFATE 1 GM/10ML PO SUSP: 1.0000 g | Freq: Four times a day (QID) | ORAL | 2 refills | Status: DC

## 2020-05-19 MED ORDER — SODIUM CHLORIDE 0.9 % IV SOLN
500.0000 mL | Freq: Once | INTRAVENOUS | Status: DC
Start: 2020-05-19 — End: 2020-05-19

## 2020-05-19 NOTE — Progress Notes (Signed)
Vitals-CW  Pt's states no medical or surgical changes since previsit or office visit.  Patient needed assistance undressing.

## 2020-05-19 NOTE — Progress Notes (Signed)
Called to room to assist during endoscopic procedure.  Patient ID and intended procedure confirmed with present staff. Received instructions for my participation in the procedure from the performing physician.  

## 2020-05-19 NOTE — Patient Instructions (Addendum)
Handouts provided on hiatal hernia and post-dilation diet.   Use Protonix (pantoprazole) 40mg  by mouth twice daily.  Use Sucralfate tablets 1 gram by mouth four times daily for 2 weeks.  Chew food especially meats and breads well and eat slowly. Feed in sitting position.  Return to GI clinic in 12 weeks.   YOU HAD AN ENDOSCOPIC PROCEDURE TODAY AT THE [facility]:   Refer to the procedure report that was given to you for any specific questions about what was found during the examination.  If the procedure report does not answer your questions, please call your gastroenterologist to clarify.  If you requested that your care partner not be given the details of your procedure findings, then the procedure report has been included in a sealed envelope for you to review at your convenience later.  YOU SHOULD EXPECT: Some feelings of bloating in the abdomen. Passage of more gas than usual.  Walking can help get rid of the air that was put into your GI tract during the procedure and reduce the bloating. If you had a lower endoscopy (such as a colonoscopy or flexible sigmoidoscopy) you may notice spotting of blood in your stool or on the toilet paper. If you underwent a bowel prep for your procedure, you may not have a normal bowel movement for a few days.  Please Note:  You might notice some irritation and congestion in your nose or some drainage.  This is from the oxygen used during your procedure.  There is no need for concern and it should clear up in a day or so.  SYMPTOMS TO REPORT IMMEDIATELY:   Following upper endoscopy (EGD)  Vomiting of blood or coffee ground material  New chest pain or pain under the shoulder blades  Painful or persistently difficult swallowing  New shortness of breath  Fever of 100F or higher  Black, tarry-looking stools  For urgent or emergent issues, a gastroenterologist can be reached at any hour by calling [phone]. Do not use MyChart messaging for  urgent concerns.    DIET:  Post-dilation diet: Clear liquids for 2 hours (until 12pm). Then a Soft diet (see handout) for the rest of today. You may return to your normal diet tomorrow if you are swallowing well. Drink plenty of fluids but you should avoid alcoholic beverages for 24 hours.  ACTIVITY:  You should plan to take it easy for the rest of today and you should NOT DRIVE or use heavy machinery until tomorrow (because of the sedation medicines used during the test).    FOLLOW UP: Our staff will call the number listed on your records 48-72 hours following your procedure to check on you and address any questions or concerns that you may have regarding the information given to you following your procedure. If we do not reach you, we will leave a message.  We will attempt to reach you two times.  During this call, we will ask if you have developed any symptoms of COVID 19. If you develop any symptoms (ie: fever, flu-like symptoms, shortness of breath, cough etc.) before then, please call [phone].  If you test positive for Covid 19 in the 2 weeks post procedure, please call and report this information to [phone].    If any biopsies were taken you will be contacted by phone or by letter within the next 1-3 weeks.  Please call us at [phone] if you have not heard about the biopsies in 3 weeks.  SIGNATURES/CONFIDENTIALITY: You and/or your care partner have signed paperwork which will be entered into your electronic medical record.  These signatures attest to the fact that that the information above on your After Visit Summary has been reviewed and is understood.  Full responsibility of the confidentiality of this discharge information lies with you and/or your care-partner.

## 2020-05-19 NOTE — Op Note (Signed)
[facility] Patient Name: Jenna Moran Procedure Date: 05/19/2020 9:42 AM MRN: [id number] Endoscopist: Lynann Bologna , MD Age: 80 Referring MD:  Date of Birth: 1940/10/20 Gender: Female Account #: [id number] Procedure:                Upper GI endoscopy Indications:              Dysphagia in a patient with known history of                            esophageal stricture. History of scleroderma                            esophagus. Medicines:                Monitored Anesthesia Care Procedure:                Pre-Anesthesia Assessment:                           - Prior to the procedure, a History and Physical                            was performed, and patient medications and                            allergies were reviewed. The patient's tolerance of                            previous anesthesia was also reviewed. The risks                            and benefits of the procedure and the sedation                            options and risks were discussed with the patient.                            All questions were answered, and informed consent                            was obtained. Prior Anticoagulants: The patient has                            taken no previous anticoagulant or antiplatelet                            agents. ASA Grade Assessment: III - A patient with                            severe systemic disease. After reviewing the risks                            and benefits, the patient was deemed in  satisfactory condition to undergo the procedure.                           After obtaining informed consent, the endoscope was                            passed under direct vision. Throughout the                            procedure, the patient's blood pressure, pulse, and                            oxygen saturations were monitored continuously. The                            Endoscope was introduced through the mouth, and                             advanced to the second part of duodenum. The upper                            GI endoscopy was accomplished without difficulty.                            The patient tolerated the procedure well. Scope In: Scope Out: Findings:                 The esophagus was mildly dilated and atonic c/w                            scleroderma esophagus. One benign-appearing,                            intrinsic mild stenosis was found 35 cm from the                            incisors at GE junction. This stenosis measured 1.4                            cm (inner diameter) x less than one cm (in length).                            The stenosis was traversed. Moderate esophagitis                            was noted in form of linear erosions associated                            with stricture (LA grade D). 8 mm diverticula was                            noted just proximal to the stricture. A TTS dilator  was passed through the scope. Dilation with a                            16-17-18 mm balloon dilator was performed to 17 mm.                            The dilation site was examined and showed moderate                            mucosal disruption and complete resolution of                            luminal narrowing. There was some oozing which                            stopped on its own. Hence, we decided to hold off                            on further dilatation. Estimated blood loss was                            minimal.                           A 2-3 cm hiatal hernia was present.                           The entire examined stomach was grossly normal.                            There was retained food in the stomach limiting                            examination.                           The examined duodenum was normal. Complications:            No immediate complications. Estimated Blood Loss:     Estimated blood loss was  minimal. Impression:               - Benign-appearing esophageal stenosis. Dilated.                           - 3 cm hiatal hernia.                           - Normal stomach.                           - Normal examined duodenum.                           - No specimens collected. Recommendation:           - Patient has a contact number available for  emergencies. The signs and symptoms of potential                            delayed complications were discussed with the                            patient. Return to normal activities tomorrow.                            Written discharge instructions were provided to the                            patient.                           - Post dilatation diet.                           - Use Protonix (pantoprazole) 40 mg PO BID #60, 11                            refills                           - Use sucralfate tablets 1 gram PO QID for 2 weeks.                            2 refills                           - Chew food especially meats and breads well and                            eat slowly. Feed in sitting position.                           - Return to GI clinic in 12 weeks.                           - The findings and recommendations were discussed                            with the patient's family. Lynann Bologna, MD 05/19/2020 10:09:46 AM This report has been signed electronically.

## 2020-05-19 NOTE — Progress Notes (Signed)
To PACU, VSS. Report to [email or web address] 

## 2020-05-21 ENCOUNTER — Telehealth: Payer: Self-pay

## 2020-05-21 ENCOUNTER — Telehealth: Payer: Self-pay | Admitting: *Deleted

## 2020-05-21 ENCOUNTER — Encounter: Payer: Self-pay | Admitting: Gastroenterology

## 2020-05-21 NOTE — Telephone Encounter (Signed)
First post procedure follow up call, no answer 

## 2020-05-21 NOTE — Telephone Encounter (Signed)
  Follow up Call-  Call back number 05/19/2020 02/13/2020 01/10/2020  Post procedure Call Back phone  # [phone]  Permission to leave phone message Yes Yes Yes     Patient questions:  Do you have a fever, pain , or abdominal swelling? No. Pain Score  0 *  Have you tolerated food without any problems? Yes.    Have you been able to return to your normal activities? Yes.    Do you have any questions about your discharge instructions: Diet   No. Medications  No. Follow up visit  No.  Do you have questions or concerns about your Care? No.  Actions: * If pain score is 4 or above: No action needed, pain <4.  1. Have you developed a fever since your procedure? no  2.   Have you had an respiratory symptoms (SOB or cough) since your procedure? no  3.   Have you tested positive for COVID 19 since your procedure no  4.   Have you had any family members/close contacts diagnosed with the COVID 19 since your procedure?  no   If yes to any of these questions please route to Laverna Peace, RN and Karlton Lemon, RN

## 2020-08-17 ENCOUNTER — Ambulatory Visit: Payer: Medicare Other | Admitting: Gastroenterology

## 2020-11-06 ENCOUNTER — Other Ambulatory Visit: Payer: Self-pay

## 2020-11-06 ENCOUNTER — Ambulatory Visit (INDEPENDENT_AMBULATORY_CARE_PROVIDER_SITE_OTHER): Payer: Medicare Other | Admitting: Gastroenterology

## 2020-11-06 ENCOUNTER — Encounter: Payer: Self-pay | Admitting: Gastroenterology

## 2020-11-06 VITALS — BP 98/58 | HR 82 | Ht 59.0 in | Wt 98.0 lb

## 2020-11-06 DIAGNOSIS — K7460 Unspecified cirrhosis of liver: Secondary | ICD-10-CM

## 2020-11-06 DIAGNOSIS — R188 Other ascites: Secondary | ICD-10-CM | POA: Diagnosis not present

## 2020-11-06 DIAGNOSIS — R1319 Other dysphagia: Secondary | ICD-10-CM | POA: Diagnosis not present

## 2020-11-06 DIAGNOSIS — K219 Gastro-esophageal reflux disease without esophagitis: Secondary | ICD-10-CM | POA: Diagnosis not present

## 2020-11-06 NOTE — Patient Instructions (Signed)
If you are age 78 or older, your body mass index should be between 23-30. Your Body mass index is 19.79 kg/m. If this is out of the aforementioned range listed, please consider follow up with your Primary Care Provider.  If you are age 26 or younger, your body mass index should be between 19-25. Your Body mass index is 19.79 kg/m. If this is out of the aformentioned range listed, please consider follow up with your Primary Care Provider.   __________________________________________________________  The  GI providers would like to encourage you to use [facility] to communicate with providers for non-urgent requests or questions.  Due to long hold times on the telephone, sending your provider a message by [facility] may be a faster and more efficient way to get a response.  Please allow 48 business hours for a response.  Please remember that this is for non-urgent requests.   Please follow up in 3 months.  Please call with any questions or concerns.  Thank you,  Dr. Lynann Bologna

## 2020-11-06 NOTE — Progress Notes (Signed)
Chief Complaint: FU  Referring Provider:  Quitman Livings, MD      ASSESSMENT AND PLAN;   #1. 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 No EV on EGD 05/2020.  Assoc splenomegaly and borderline platelets, no HE. Not a liver transplant candidate d/t multiple comorbid conditions.  Well-preserved synthetic liver function with alb 3.1 10/2020.  #2. GERD with dysphagia d/t eso stricture s/p dil 17 mm with resolution.  #3. 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 Constipation. Neg colon at age 60 per pt at [facility]. No need for rpt colon.  Plan: -Low Na diet diet with normal protein diet. -Aldactone 25mg  po BID to continue.  Can continue Lasix on as needed basis. -Decrease K supplements to qd -FU in 3 months -D/W extensively with pt and pt's husband. -Under excellent care with Dr. .     HPI:    Jenna Moran is a 80 y.o. 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 1995  With new onset liver cirrhosis on CT with ascites, BM Bx showing atypical lymphohistiocytic proliferation (granulomatous involvement likely sarcoidosis) on prednisone.  Patient is wheelchair-bound  She denies having any nausea, vomiting, heartburn, regurgitation, diarrhea or constipation.  She denies having any abdominal pain. No jaundice, dark urine or recent weight loss.  No further dysphagia after dilatation 05/2020 to 17 mm TTS balloon.  She is currently on Aldactone 25 mg p.o. twice daily.  Her previous  potassium was 2.9.  She was given potassium supplements BID.  Her most recent K is 5.7 (as below).  She uses Lasix on as needed basis.  No heartburn. No change in mental status.  No fever chills or night sweats.   From past records:  -09/2020 Adm d/t N/V/D, better with reglan/zofran/remeron. Given short course of Marinol. CT as below showed cirrhosis.  Also had repeat bone marrow biopsy.  CT AP 09/10/2020 1. Cirrhosis, with large volume ascites increased since prior study.  2. Mild diffuse bowel wall thickening involving the small bowel and  colon, likely due to hypoproteinemic state given cirrhosis and  adjacent ascites.  3. Cholelithiasis without cholecystitis.  4. Small bilateral pleural effusions.  5. Stable hiatal hernia, with fluid-filled dilated distal esophagus  compatible with history of scleroderma.  6. Interval resolution of splenomegaly.  7. Diffuse anasarca.  -Known scleroderma associated erosive esophagitis and stricture.  Multiple dilatations with Dr. 11/10/2020.  EGD 05/2020 - Benign-appearing esophageal stenosis. Dilated to 17 mm. - 3 cm hiatal hernia. - Normal stomach. - Normal examined duodenum. - No specimens collected.  -EGD 01/05/2017: esophageal stricture, dilated to 16.70mm and grade D esophagitis. She is here today for repeat EGD with dilation following BID PPI therapy.    -EGD 1/2019at [facility]: Post-operative Diagnosis: 1. Peptic stricture, appears improved since last dilation. This was dilated to 18 mm with mucosal disruption. Healed esophagitis. 2. Mod HH. Bx done to r/o EoE- I didn't see Bx report in care everywhere.    PMH:  Undifferentiated connective tissue disease (HCC)   Inflammatory arthritis   Scleroderma (HCC)   Mild acid reflux   ILD (interstitial lung disease) (HCC)  Gastroesophageal reflux disease with esophagitis     Past Medical History:  Diagnosis Date   COPD (chronic obstructive pulmonary disease) (HCC)    GERD (gastroesophageal  reflux disease)    Glaucoma    History of colonic polyps    Hypertension    Scleroderma ([place])    Thyroid disease     Past Surgical History:  Procedure Laterality Date   BACK SURGERY  1986   CATARACT EXTRACTION, BILATERAL     COLONOSCOPY     age 66 [facility]   ESOPHAGOGASTRODUODENOSCOPY  03/09/2017   [place] [facility]     Family History  Problem Relation Age of Onset   Colon cancer Maternal Grandmother    Esophageal cancer Neg Hx    Rectal cancer Neg Hx    Stomach cancer Neg Hx     Social History   Tobacco Use   Smoking status: Never   Smokeless tobacco: Never  Vaping Use   Vaping Use: Never used  Substance Use Topics   Alcohol use: Not Currently   Drug use: Never    Current Outpatient Medications  Medication Sig Dispense Refill   Cholecalciferol 25 MCG (1000 UT) tablet Take by mouth.     EUTHYROX 150 MCG tablet Take 1 tablet by mouth daily.     furosemide (LASIX) 20 MG tablet SMARTSIG:1 Tablet(s) By Mouth     mirtazapine (REMERON) 15 MG tablet Take 15 mg by mouth at bedtime.     pantoprazole (PROTONIX) 40 MG tablet Take 1 tablet (40 mg total) by mouth 2 (two) times daily. 60 tablet 11   potassium chloride 20 MEQ/15ML (10%) SOLN Take 20 mEq by mouth 2 (two) times daily.     predniSONE (DELTASONE) 10 MG tablet Take 1 tablet by mouth daily.     spironolactone (ALDACTONE) 25 MG tablet Take 25 mg by mouth 2 (two) times daily.     traMADol (ULTRAM) 50 MG tablet Take by mouth.     No current facility-administered medications for this visit.    Allergies  Allergen Reactions   Levofloxacin Rash and Hives   Codeine Nausea And Vomiting and Other (See Comments)    "Deathly sick"    Hydrocodone Nausea And Vomiting   Oxycodone Nausea And Vomiting   Plaquenil [Hydroxychloroquine]     Review of Systems:  Frail female      Physical Exam:    BP (!) 98/58   Pulse 82   Ht $R'4\' 11"'Cp$  (1.499 m)   Wt 98 lb (44.5  kg) Comment: wheel chair  SpO2 95%   BMI 19.79 kg/m  Wt Readings from Last 3 Encounters:  11/06/20 98 lb (44.5 kg)  05/19/20 112 lb (50.8 kg)  05/05/20 112 lb (50.8 kg)   Constitutional: Frail, in no acute distress. Psychiatric: Normal mood and affect. Behavior is normal. HEENT: Conjunctivae are normal. No scleral icterus.  Cardiovascular: Normal rate, regular rhythm. No edema Pulmonary/chest: B/L decreased breath sounds with fine rales.  No wheezing. Abdominal: Soft, nondistended. Nontender. Bowel sounds active throughout. There are no masses palpable. No hepatomegaly. Neurological: Alert and oriented to person place and time. Skin: Skin is warm and dry. No rashes noted.  Data Reviewed: I have personally reviewed following labs and imaging studies CBC and Differential Order: [id number]  Ref Range & Units 2 d ago  WBC 4.4 - 11.0 x 10*3/uL 3.3 Low    RBC 4.10 - 5.10 x 10*6/uL 3.10 Low  Hemoglobin 12.3 - 15.3 G/DL 10.1 Low    Hematocrit 35.9 - 44.6 % 30.2 Low    MCV 80.0 - 96.0 FL 97.4 High    MCH 27.5 - 33.2 PG 32.6   MCHC 33.0 - 37.0 G/DL 33.5   RDW 12.3 - 17.0 % 17.1 High    Platelets 150 - 450 X 10*3/uL 103 Low     Comprehensive Metabolic Panel Order: [id number]  Ref Range & Units 2 d ago  Sodium 135 - 146 MMOL/L 131 Low    Potassium 3.5 - 5.3 MMOL/L 5.7 High    Chloride 98 - 110 MMOL/L 105   CO2 23 - 30 MMOL/L 20 Low    BUN 8 - 24 MG/DL 14   Glucose 70 - 99 MG/DL 116 High    Comment: Patients taking eltrombopag at doses >/= 100 mg daily may show falsely elevated values of 10% or greater.  Creatinine 0.50 - 1.50 MG/DL 1.14   Calcium 8.5 - 10.5 MG/DL 8.4 Low    Total Protein 6.0 - 8.3 G/DL 4.9 Low    Comment: Patients taking eltrombopag at doses >/= 100 mg daily may show falsely elevated values of 10% or greater.  Albumin  3.5 - 5.0 G/DL 3.1 Low    Total Bilirubin 0.1 - 1.2 MG/DL 0.8   Comment: Patients taking eltrombopag at doses >/= 100 mg daily may show falsely  elevated values of 10% or greater.  Alkaline Phosphatase 25 - 125 IU/L or U/L 79   AST (SGOT) 5 - 40 IU/L or U/L 18   ALT (SGPT) 5 - 50 IU/L or U/L 13   Anion Gap 4 - 14 MMOL/L 6   Est. GFR >=60 ML/MIN/1.73 M*2  ML/MIN/1.73 M*2 50 Low      Carmell Austria, MD 11/06/2020, 2:34 PM  Cc: Antonietta Jewel, MD

## 2021-01-07 DEATH — deceased

## 2022-05-04 IMAGING — RF DG ESOPHAGUS
7 series · 24 of 24 positions shown · non-contrast
Comparison: Chest CT 09/25/2019

CLINICAL DATA: Dysphagia. Gastroesophageal reflux disease. History
of scleroderma.

EXAM:
ESOPHOGRAM / BARIUM SWALLOW / BARIUM TABLET STUDY
TECHNIQUE: Combined double contrast and single contrast examination performed
using effervescent crystals, thick barium liquid, and thin barium
liquid. The patient was observed with fluoroscopy swallowing a 13 mm
barium sulphate tablet.
FLUOROSCOPY TIME:  Fluoroscopy Time:  3 minutes, 0 seconds
Radiation Exposure Index (if provided by the fluoroscopic device):
17.1 mGy
Number of Acquired Spot Images: 0

[Series 1: cp_standard · 0.35mm/px · 3 of 84 frames shown (1 of 7)]
[frame 13/84]
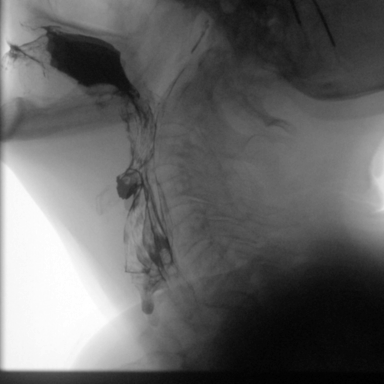
[frame 43/84]
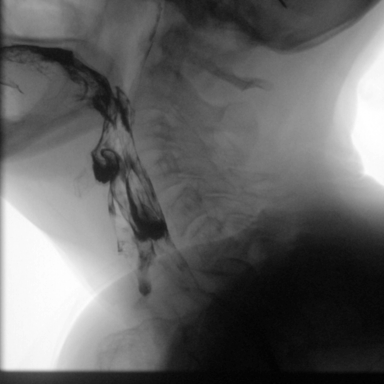
[frame 72/84]
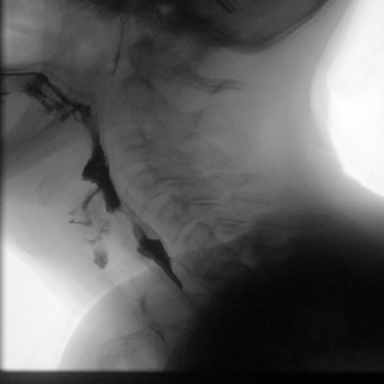

[Series 2: cp_standard · 0.34mm/px · 4 of 190 frames shown (2 of 7)]
[frame 9/190]
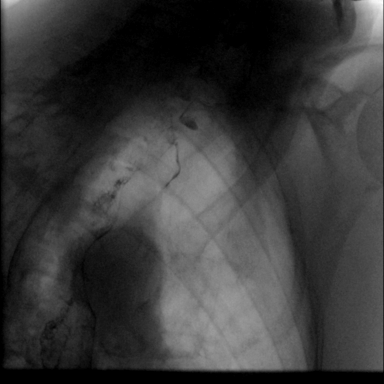
[frame 29/190]
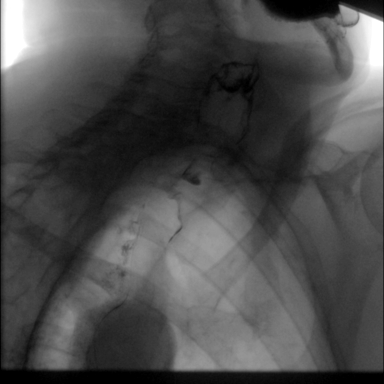
[frame 96/190]
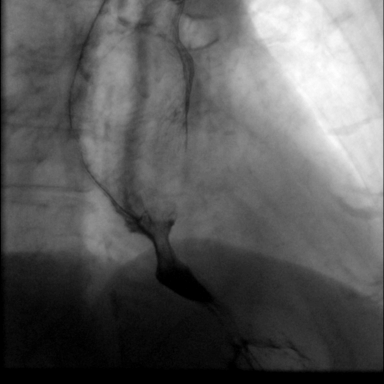
[frame 162/190]
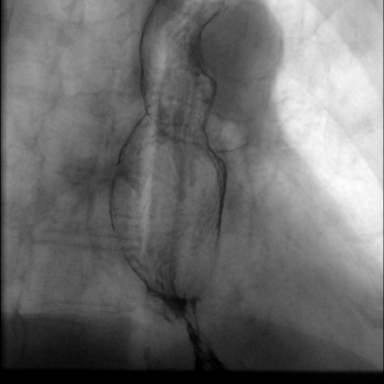

[Series 3: cp_standard · 0.34mm/px · 3 of 43 frames shown (3 of 7)]
[frame 7/43]
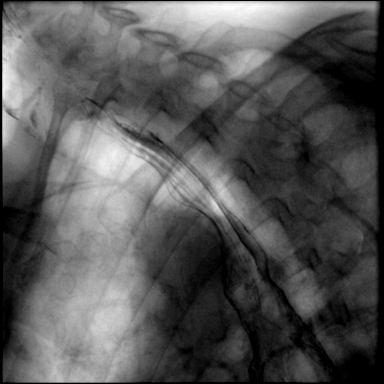
[frame 22/43]
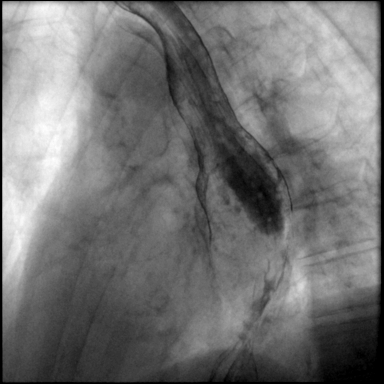
[frame 37/43]
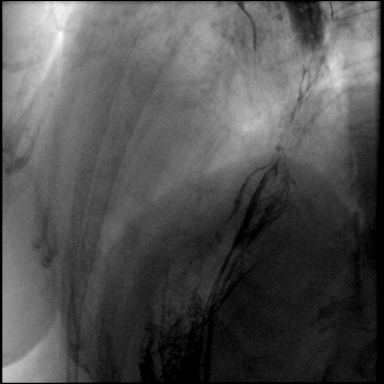

[Series 4: cp_standard · 0.35mm/px · 4 of 36 frames shown (4 of 7)]
[frame 6/36]
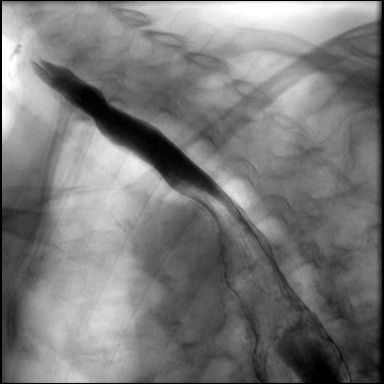
[frame 19/36]
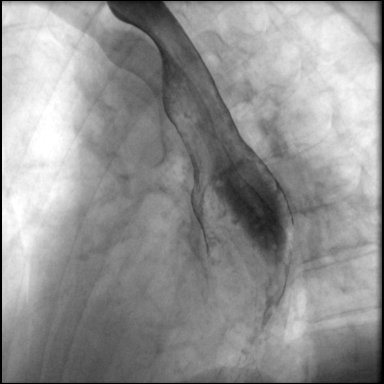
[frame 31/36]
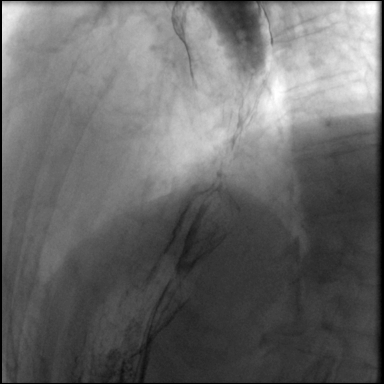
[frame 33/36]
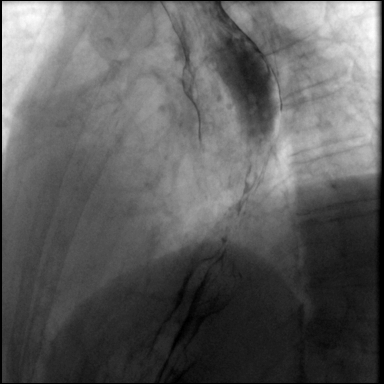

[Series 5: cp_standard · 0.35mm/px · 3 of 34 frames shown (5 of 7)]
[frame 6/34]
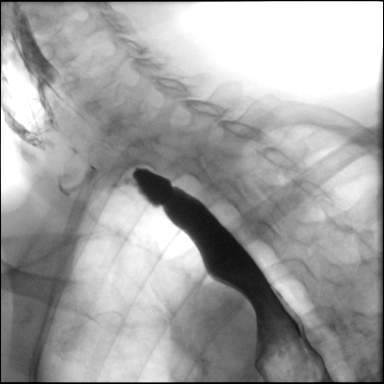
[frame 29/34]
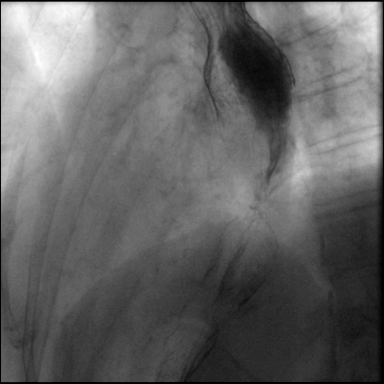
[frame 34/34]
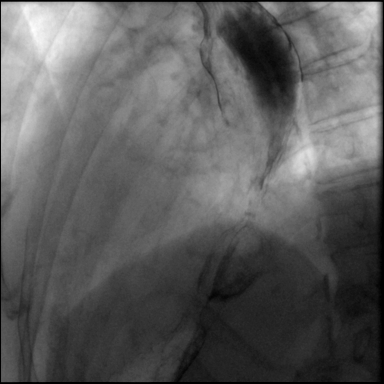

[Series 6: cp_standard · 0.35mm/px · 4 of 83 frames shown (6 of 7)]
[frame 13/83]
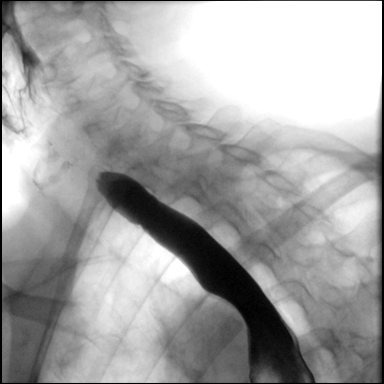
[frame 42/83]
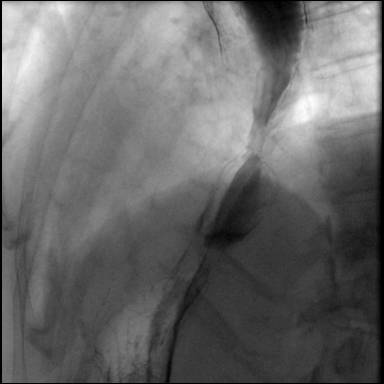
[frame 53/83]
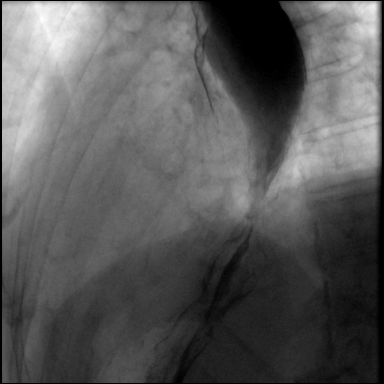
[frame 71/83]
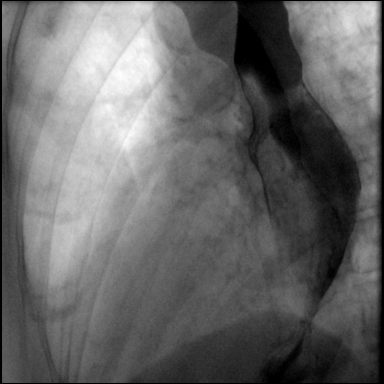

[Series 7: cp_standard · 0.35mm/px · 3 of 51 frames shown (7 of 7)]
[frame 8/51]
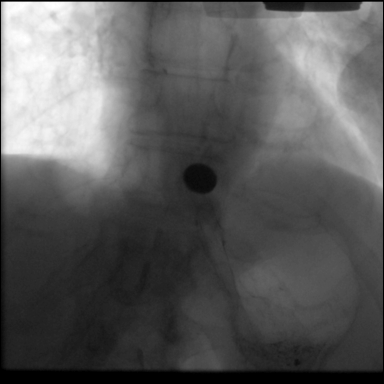
[frame 26/51]
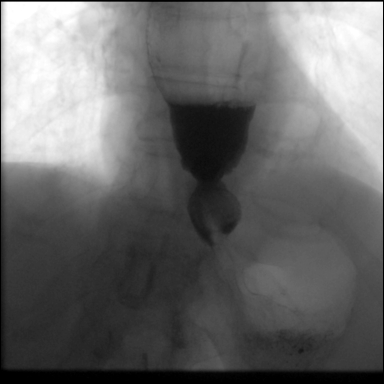
[frame 44/51]
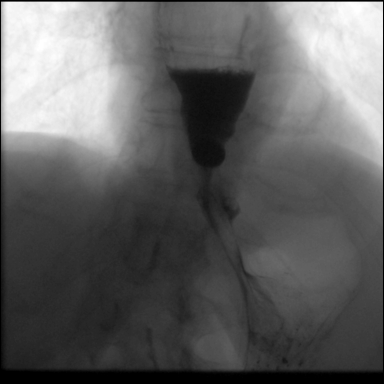

[24 of 24 positions shown; findings below may reference images not displayed]

FINDINGS: There is frank tracheal aspiration during swallowing. This did not
clear well with cough. Piriform and vallecular residue noted after
swallowing.

Primary peristaltic waves were disrupted in the mid to upper
esophagus on [DATE] swallows. Dilated distal esophagus. Narrowing or
stricturing of the distal esophagus noted, which causes the 13 mm
barium pill to impact. Maximum diameter was about 0.9 cm in the
vicinity of this stricturing. There is some minimal irregularity of
this focal gastroesophageal stricture and in the esophagus just
above the stricture as shown on image 110 of series 2.

Small hiatal hernia noted.
IMPRESSION: 1. Disruption of primary peristaltic waves in the esophagus with
dilated distal esophagus, likely attributable to scleroderma.
2. Focal stricture of the distal esophagus with mild irregularity
both long the stricture and in the distal esophagus adjacent to the
stricture. Correlating with the report from prior EGD of 03/09/2017,
the patient has a known peptic stricture. Currently this measures
only about 0.9 cm in diameter. Endoscopy for inspection to exclude
malignancy, and for possible dilation therapy, should be considered.
3. Frank tracheal aspiration during swallowing, with piriform and
vallecular residue noted. Consider speech pathology consultation.
4. Small hiatal hernia.
